# Patient Record
Sex: Female | Born: 1985 | Race: Black or African American | Hispanic: No | Marital: Married | Smoking: Never smoker
Health system: Southern US, Community
[De-identification: ages and names within clinical notes are randomized; demographics above are authoritative.]

## PROBLEM LIST (undated history)

## (undated) DIAGNOSIS — D219 Benign neoplasm of connective and other soft tissue, unspecified: Secondary | ICD-10-CM

## (undated) HISTORY — DX: Benign neoplasm of connective and other soft tissue, unspecified: D21.9

---
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
## 2018-05-08 ENCOUNTER — Ambulatory Visit
Admission: RE | Admit: 2018-05-08 | Discharge: 2018-05-08 | Disposition: A | Discharge status: Home / Self Care | Payer: 59 | Source: Ambulatory Visit | Attending: Sports Medicine | Admitting: Sports Medicine

## 2018-05-08 ENCOUNTER — Ambulatory Visit (INDEPENDENT_AMBULATORY_CARE_PROVIDER_SITE_OTHER): Payer: 59 | Admitting: Sports Medicine

## 2018-05-08 VITALS — BP 144/83 | Ht 64.0 in | Wt 269.0 lb

## 2018-05-08 DIAGNOSIS — M25561 Pain in right knee: Secondary | ICD-10-CM | POA: Diagnosis not present

## 2018-05-08 MED ORDER — MELOXICAM 15 MG PO TABS: ORAL_TABLET | ORAL | 0 refills | Status: DC

## 2018-05-08 NOTE — Progress Notes (Signed)
   Subjective:    Patient ID: Stacey Zuniga, female    DOB: [birth year], 33 y.o.   MRN: [id number]  HPI chief complaint: Right knee pain  Very pleasant 33 year old female comes in today after having injured her right knee while working out 2 days ago.  She denies any traumatic injury but began to experience diffuse pain around the knee after performing a lot of dead lifts and squats.  She has had pain in the anterior knee intermittently for quite some time but her current pain is along the medial knee and is different in nature than what she has experienced previously.  She did notice some swelling diffusely around the knee as well.  She has started to limp.  She tried Tylenol, Goody's powders, and muscle relaxers with minimal symptom improvement.  She denies any previous knee surgery.  Pain does not radiate.  Past medical history reviewed Medications reviewed Allergies reviewed    Review of Systems    As above Objective:   Physical Exam  Well-developed, overweight.  No acute distress.  Awake alert and oriented x3.  Vital signs reviewed  Right knee: Range of motion is 0 to 120 degrees.  Trace effusion.  She is tender to palpation along the medial joint line.  Some tenderness along the lateral joint line as well.  Knee is stable to ligamentous exam.  Positive Thessaly's.  No patellofemoral crepitus.  Neurovascularly intact distally.  Walking with a slight limp.  MSK ultrasound of the right knee is limited by body habitus but she does have a small joint effusion.  X-rays of the right knee show some mild degenerative changes in the medial compartment as well as a joint effusion.  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  Compression sleeve with activity.  She needs to avoid any sort of lower body exercise until follow-up with me in 2 weeks.   If symptoms persist or worsen consider merits of further diagnostic imaging.  Call with questions or concerns in the interim.

## 2018-05-09 ENCOUNTER — Encounter: Payer: Self-pay | Admitting: Sports Medicine

## 2018-05-22 ENCOUNTER — Ambulatory Visit (INDEPENDENT_AMBULATORY_CARE_PROVIDER_SITE_OTHER): Payer: 59 | Admitting: Sports Medicine

## 2018-05-22 ENCOUNTER — Other Ambulatory Visit: Payer: Self-pay

## 2018-05-22 VITALS — BP 116/88 | Ht 64.0 in | Wt 271.0 lb

## 2018-05-22 DIAGNOSIS — M25561 Pain in right knee: Secondary | ICD-10-CM | POA: Diagnosis not present

## 2018-05-22 NOTE — Progress Notes (Signed)
   Subjective:    Patient ID: Stacey Zuniga , female    DOB: 1985/07/07, 33 y.o.   MRN: [id number]  05/22/2018 Patient reports that she believes that her right knee has had some improvement.  States that she is able to bear weight now and walk however she is still having some concerns with certain activities such as squats and getting in and out of her car.  Patient states that overall she is doing better however she is not to 100%.  She is interested in investigating further today. DG of knee showed mild OA and medial compartment  05/08/2018 Very pleasant 33 year old female comes in today after having injured her right knee while working out 2 days ago.  She denies any traumatic injury but began to experience diffuse pain around the knee after performing a lot of dead lifts and squats.  She has had pain in the anterior knee intermittently for quite some time but her current pain is along the medial knee and is different in nature than what she has experienced previously.  She did notice some swelling diffusely around the knee as well.  She has started to limp.  She tried Tylenol, Goody's powders, and muscle relaxers with minimal symptom improvement.  She denies any previous knee surgery.  Pain does not radiate.    ROS per HPI    Objective:  Gen: NAD, comfortable in exam room  Knee, right: TTP noted along medial joint line. Inspection was negative for effusion. No obvious bony abnormalities or signs of osteophyte development.  Medial joint line tenderness; Patellar and quadriceps tendons unremarkable, and no tenderness of the pes anserine bursa. ROM normal in flexion and extension. Normal hamstring and quadriceps strength. Neurovascularly intact bilaterally.     Assessment & Plan:   Right knee pain 2/2 medial compartmental DJD vs degenerative meniscal tear  DG of right knee did show some mild OA.  Patient does report improvement in symptoms however after meloxicam as well as avoiding lower  body workouts.  However patient is still experiencing some symptoms with getting in and out of the car as well as some squats.  Discussed need for further diagnostic imaging and MRI scheduled during visit in order to evaluate further for meniscal tear.  Will call patient with results and discuss options based on MRI findings.  Stacey Chyann Ambrocio, DO PGY-2, [facility]   Patient seen and evaluated with the resident.  I agree with the above plan of care.  Patient has mild medial compartmental DJD but I am concerned about a medial meniscal tear.  MRI of the right knee to evaluate further.  Phone follow-up with those results when available and we will delineate a more definitive treatment plan based on those findings.

## 2018-05-26 ENCOUNTER — Other Ambulatory Visit: Payer: 59

## 2020-02-11 LAB — OB RESULTS CONSOLE HIV ANTIBODY (ROUTINE TESTING): HIV: NONREACTIVE

## 2020-02-11 LAB — OB RESULTS CONSOLE GC/CHLAMYDIA
Chlamydia: NEGATIVE
Gonorrhea: NEGATIVE

## 2020-02-11 LAB — OB RESULTS CONSOLE RUBELLA ANTIBODY, IGM: Rubella: IMMUNE

## 2020-03-14 IMAGING — DX DG KNEE AP/LAT W/ SUNRISE*R*
3 series · 3 of 3 positions shown · non-contrast
Comparison: None.

CLINICAL DATA: New onset right knee pain and swelling, no known
injury, initial encounter

EXAM:
RIGHT KNEE 3 VIEWS

[dg knee ap/lat w/ sunrise right (1 of 3)]
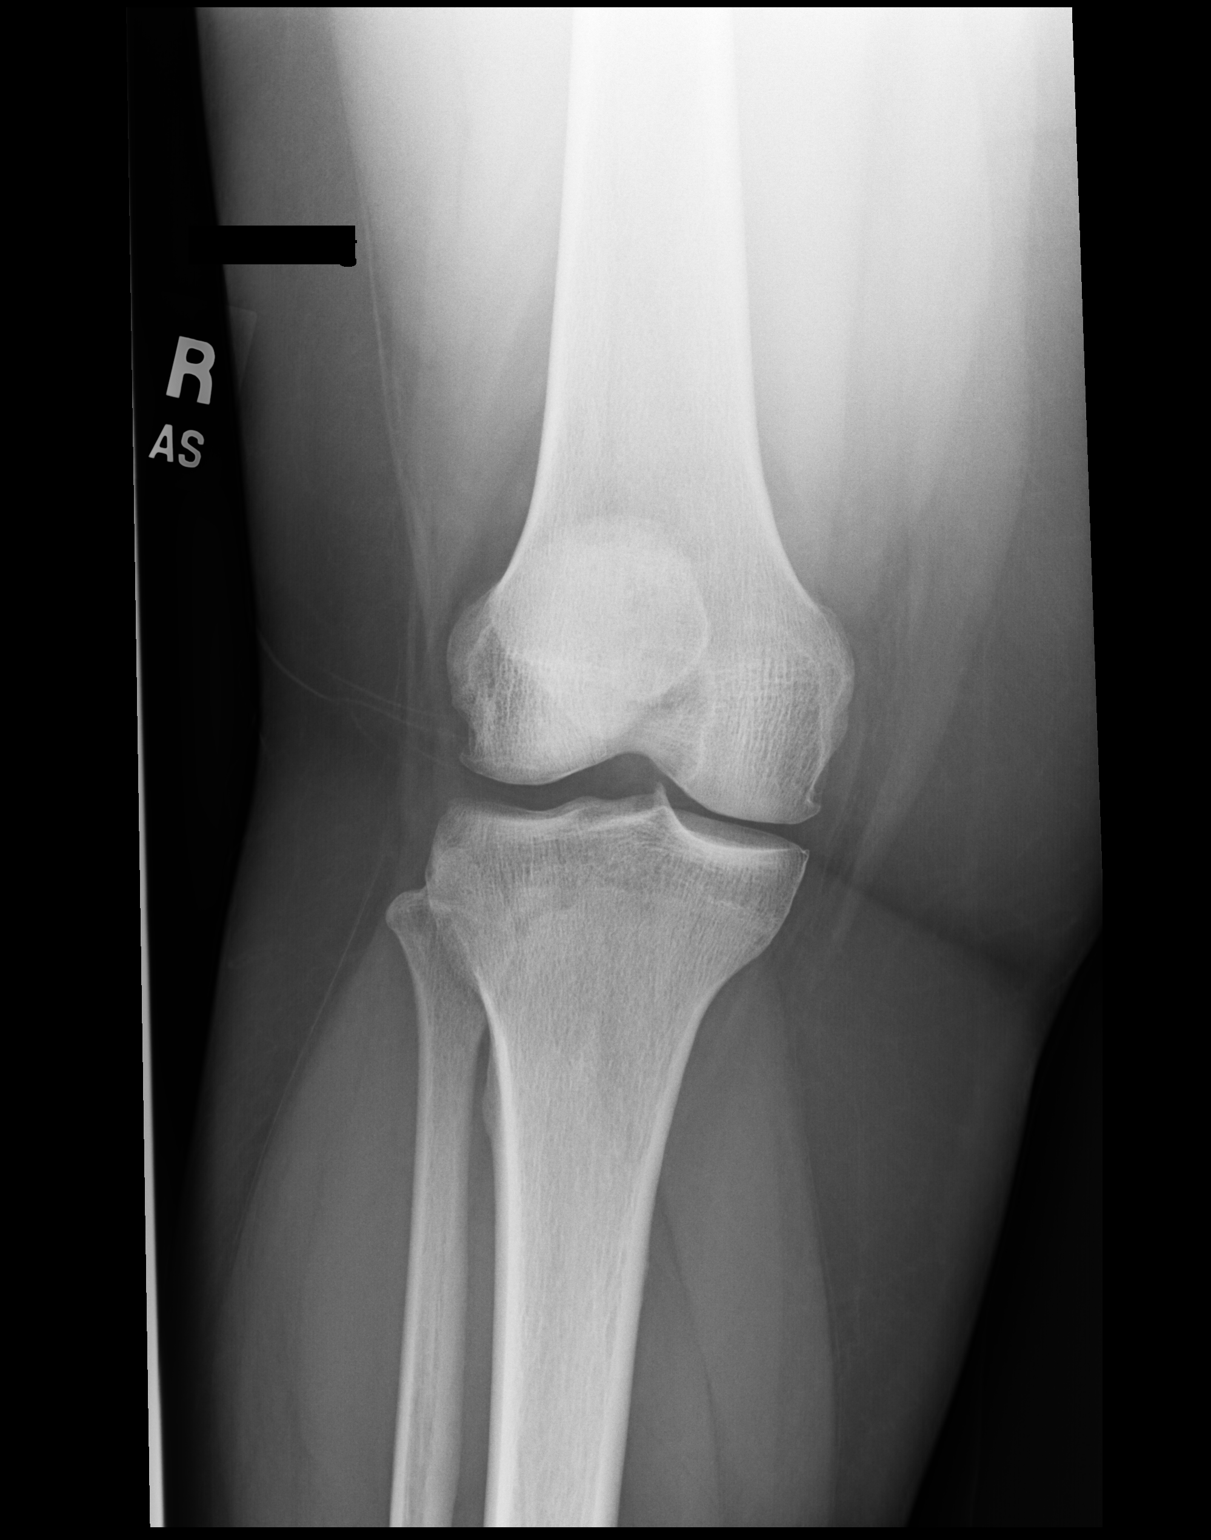

[dg knee ap/lat w/ sunrise right (2 of 3)]
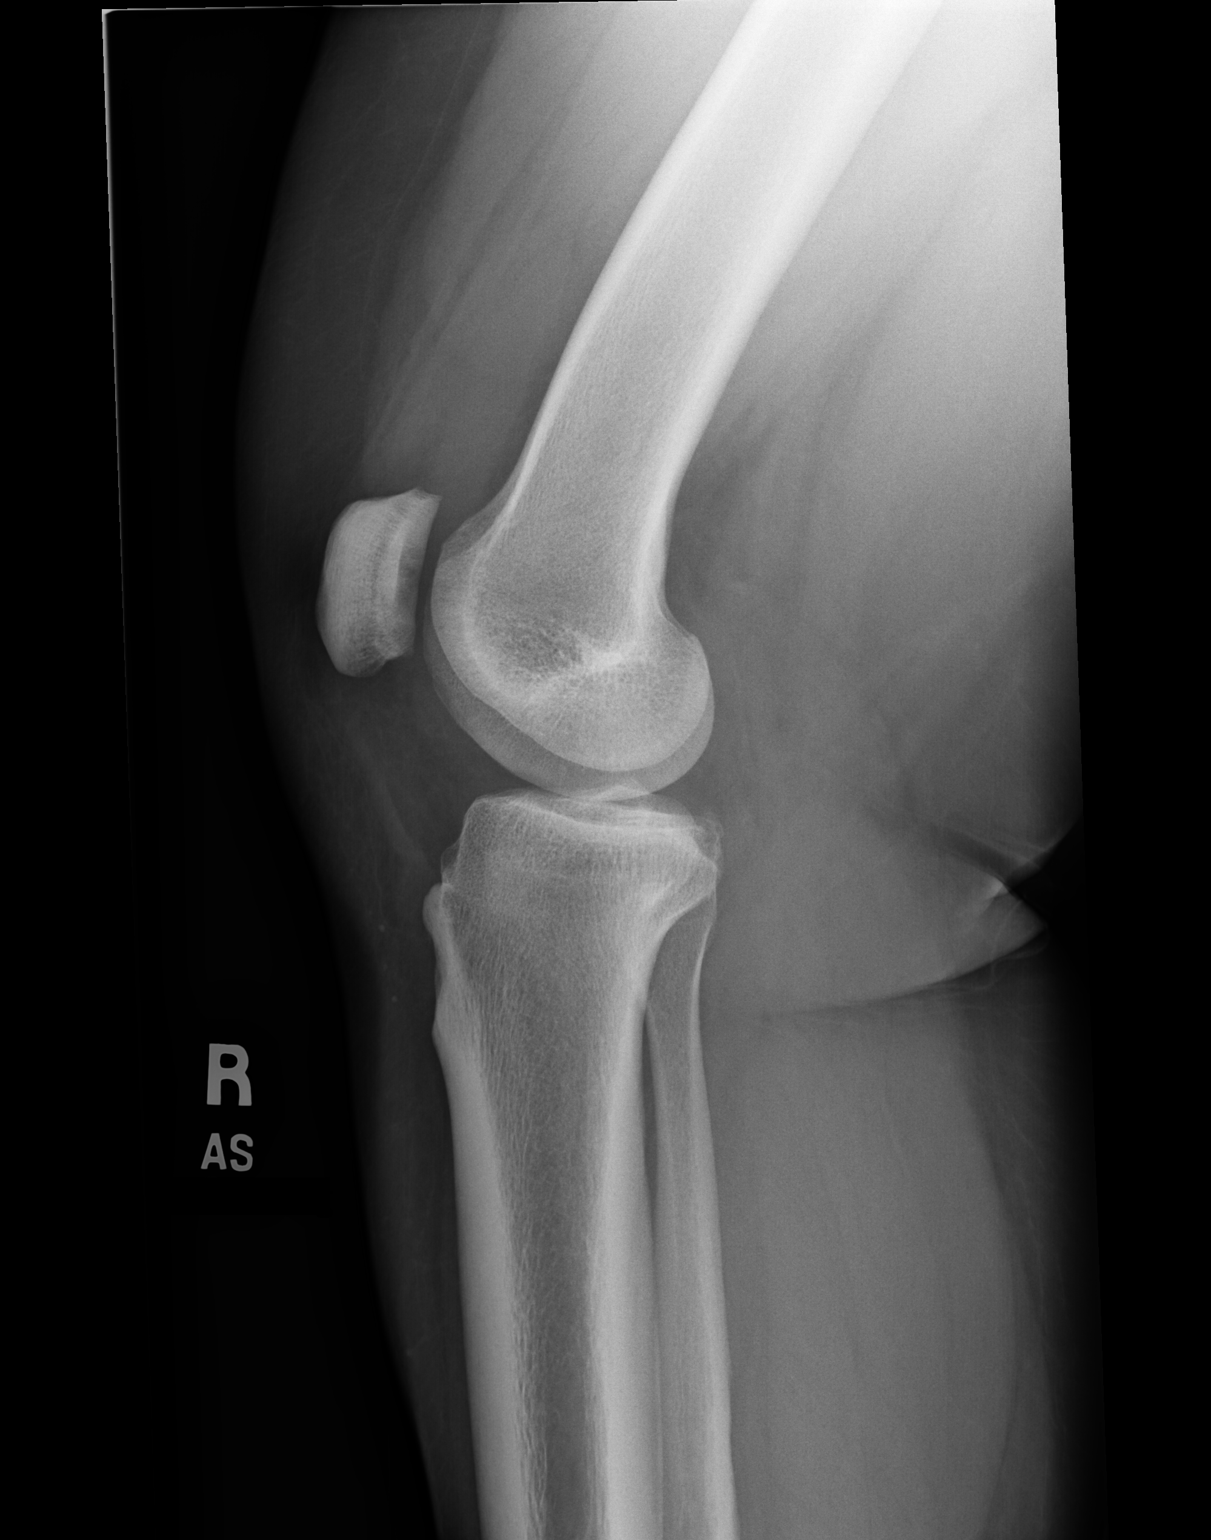

[dg knee ap/lat w/ sunrise right (3 of 3)]
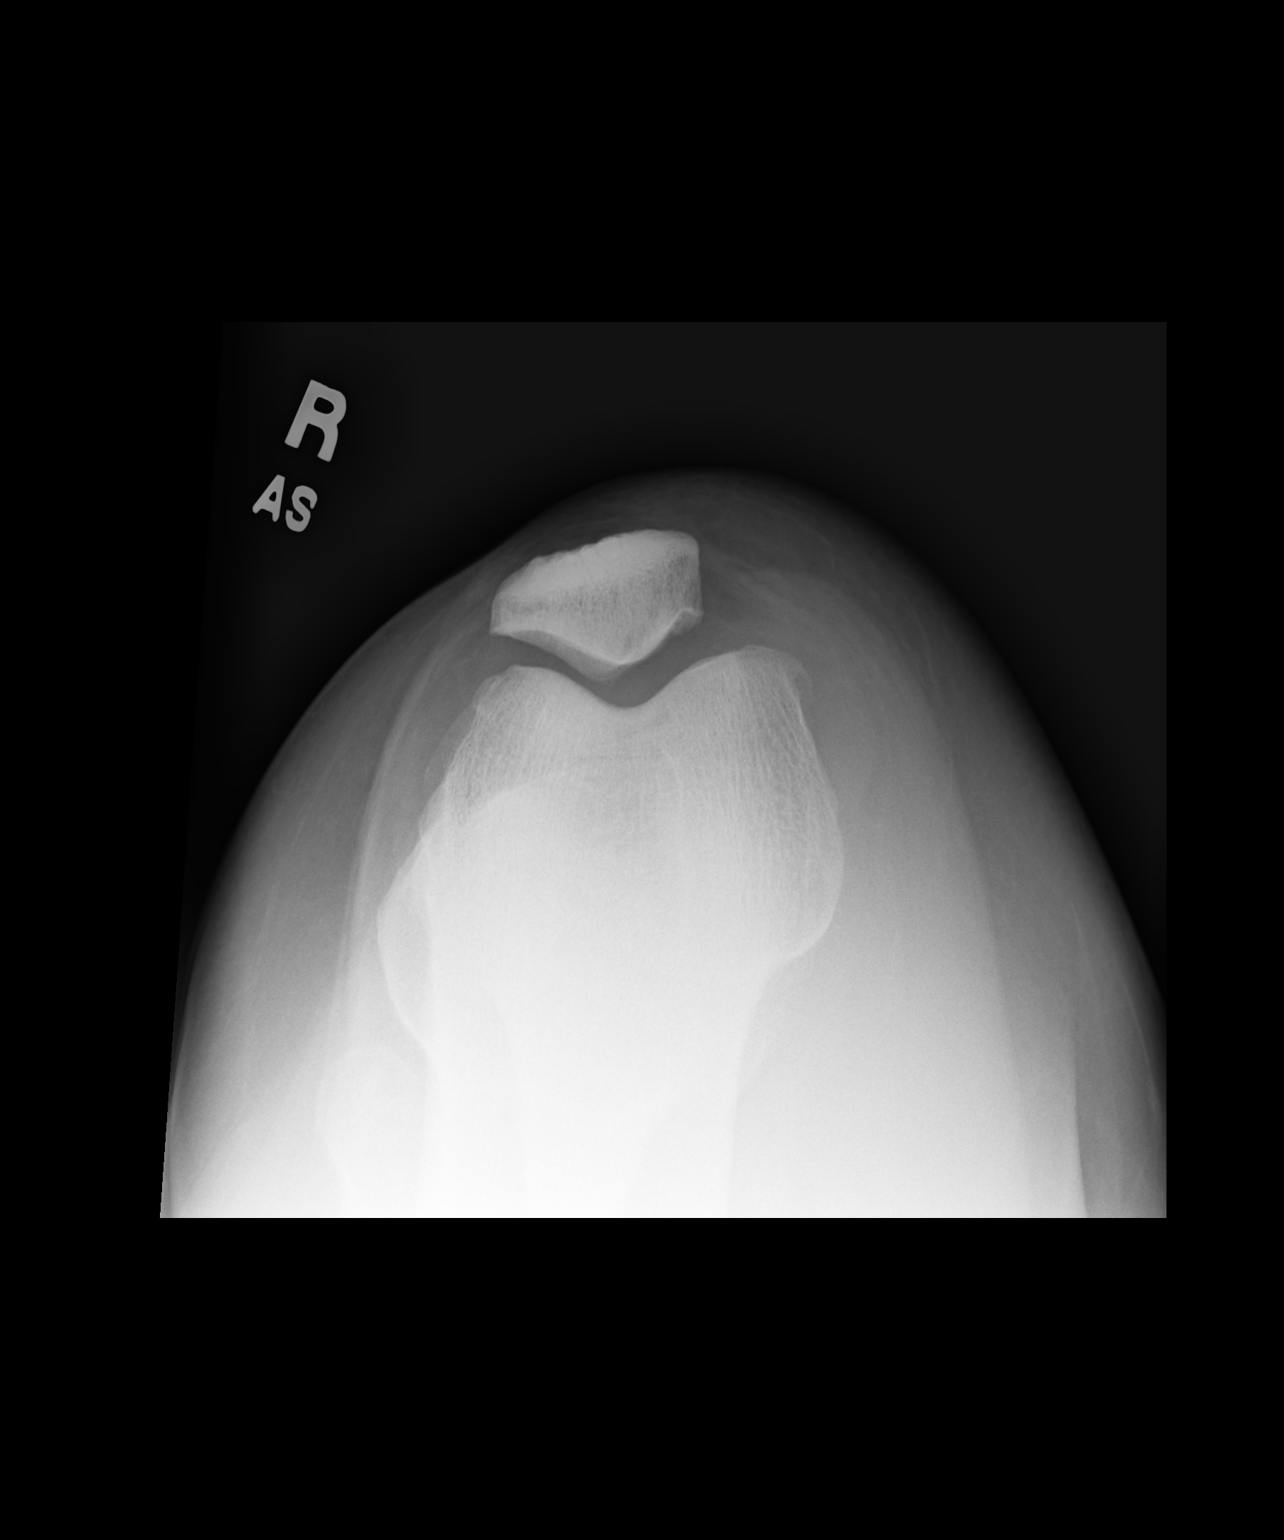

[3 of 3 positions shown; findings below may reference images not displayed]

FINDINGS: Tricompartmental degenerative changes are noted with small joint
effusion. No acute fracture or dislocation is noted.
IMPRESSION: Mild degenerative change with joint effusion.

## 2020-06-09 ENCOUNTER — Other Ambulatory Visit: Payer: Self-pay | Admitting: Obstetrics and Gynecology

## 2020-06-09 DIAGNOSIS — O26843 Uterine size-date discrepancy, third trimester: Secondary | ICD-10-CM

## 2020-06-09 DIAGNOSIS — O09523 Supervision of elderly multigravida, third trimester: Secondary | ICD-10-CM

## 2020-06-09 DIAGNOSIS — Z363 Encounter for antenatal screening for malformations: Secondary | ICD-10-CM

## 2020-06-09 DIAGNOSIS — Z3A31 31 weeks gestation of pregnancy: Secondary | ICD-10-CM

## 2020-06-18 ENCOUNTER — Encounter: Payer: Self-pay | Admitting: *Deleted

## 2020-06-19 ENCOUNTER — Other Ambulatory Visit: Payer: Self-pay

## 2020-06-19 ENCOUNTER — Other Ambulatory Visit: Payer: Self-pay | Admitting: *Deleted

## 2020-06-19 ENCOUNTER — Ambulatory Visit: Payer: 59 | Attending: Obstetrics and Gynecology | Admitting: *Deleted

## 2020-06-19 ENCOUNTER — Encounter: Payer: Self-pay | Admitting: *Deleted

## 2020-06-19 ENCOUNTER — Ambulatory Visit (HOSPITAL_BASED_OUTPATIENT_CLINIC_OR_DEPARTMENT_OTHER): Payer: 59

## 2020-06-19 VITALS — BP 137/88 | HR 94

## 2020-06-19 DIAGNOSIS — O09523 Supervision of elderly multigravida, third trimester: Secondary | ICD-10-CM

## 2020-06-19 DIAGNOSIS — Z3A31 31 weeks gestation of pregnancy: Secondary | ICD-10-CM | POA: Diagnosis not present

## 2020-06-19 DIAGNOSIS — O99213 Obesity complicating pregnancy, third trimester: Secondary | ICD-10-CM | POA: Insufficient documentation

## 2020-06-19 DIAGNOSIS — O26843 Uterine size-date discrepancy, third trimester: Secondary | ICD-10-CM | POA: Diagnosis not present

## 2020-06-19 DIAGNOSIS — O3123X2 Continuing pregnancy after intrauterine death of one fetus or more, third trimester, fetus 2: Secondary | ICD-10-CM | POA: Diagnosis not present

## 2020-06-19 DIAGNOSIS — Z363 Encounter for antenatal screening for malformations: Secondary | ICD-10-CM | POA: Diagnosis not present

## 2020-06-19 DIAGNOSIS — O3413 Maternal care for benign tumor of corpus uteri, third trimester: Secondary | ICD-10-CM | POA: Diagnosis not present

## 2020-06-19 DIAGNOSIS — O30043 Twin pregnancy, dichorionic/diamniotic, third trimester: Secondary | ICD-10-CM | POA: Diagnosis not present

## 2020-06-19 DIAGNOSIS — Z3A29 29 weeks gestation of pregnancy: Secondary | ICD-10-CM | POA: Insufficient documentation

## 2020-06-19 DIAGNOSIS — D259 Leiomyoma of uterus, unspecified: Secondary | ICD-10-CM | POA: Diagnosis not present

## 2020-06-19 DIAGNOSIS — O09513 Supervision of elderly primigravida, third trimester: Secondary | ICD-10-CM

## 2020-07-01 LAB — OB RESULTS CONSOLE RPR: RPR: NONREACTIVE

## 2020-07-03 ENCOUNTER — Ambulatory Visit: Payer: 59

## 2020-07-17 ENCOUNTER — Encounter: Payer: Self-pay | Admitting: *Deleted

## 2020-07-17 ENCOUNTER — Ambulatory Visit (HOSPITAL_BASED_OUTPATIENT_CLINIC_OR_DEPARTMENT_OTHER): Payer: 59

## 2020-07-17 ENCOUNTER — Other Ambulatory Visit: Payer: Self-pay

## 2020-07-17 ENCOUNTER — Ambulatory Visit: Payer: 59 | Attending: Maternal & Fetal Medicine | Admitting: *Deleted

## 2020-07-17 VITALS — BP 139/83 | HR 97

## 2020-07-17 DIAGNOSIS — Z362 Encounter for other antenatal screening follow-up: Secondary | ICD-10-CM

## 2020-07-17 DIAGNOSIS — O99213 Obesity complicating pregnancy, third trimester: Secondary | ICD-10-CM

## 2020-07-17 DIAGNOSIS — O26843 Uterine size-date discrepancy, third trimester: Secondary | ICD-10-CM

## 2020-07-17 DIAGNOSIS — O09523 Supervision of elderly multigravida, third trimester: Secondary | ICD-10-CM | POA: Insufficient documentation

## 2020-07-17 DIAGNOSIS — Z3A33 33 weeks gestation of pregnancy: Secondary | ICD-10-CM | POA: Diagnosis not present

## 2020-07-17 DIAGNOSIS — O09513 Supervision of elderly primigravida, third trimester: Secondary | ICD-10-CM

## 2020-07-17 DIAGNOSIS — O30003 Twin pregnancy, unspecified number of placenta and unspecified number of amniotic sacs, third trimester: Secondary | ICD-10-CM | POA: Insufficient documentation

## 2020-07-17 DIAGNOSIS — O3123X0 Continuing pregnancy after intrauterine death of one fetus or more, third trimester, not applicable or unspecified: Secondary | ICD-10-CM | POA: Diagnosis not present

## 2020-07-18 ENCOUNTER — Other Ambulatory Visit: Payer: Self-pay | Admitting: *Deleted

## 2020-07-18 DIAGNOSIS — R638 Other symptoms and signs concerning food and fluid intake: Secondary | ICD-10-CM

## 2020-07-31 ENCOUNTER — Ambulatory Visit: Payer: 59

## 2020-08-07 LAB — OB RESULTS CONSOLE GBS: GBS: NEGATIVE

## 2020-08-08 ENCOUNTER — Encounter: Payer: Self-pay | Admitting: *Deleted

## 2020-08-08 ENCOUNTER — Ambulatory Visit: Payer: 59 | Admitting: *Deleted

## 2020-08-08 ENCOUNTER — Ambulatory Visit: Payer: 59 | Attending: Obstetrics and Gynecology

## 2020-08-08 ENCOUNTER — Other Ambulatory Visit: Payer: Self-pay

## 2020-08-08 VITALS — BP 141/84 | HR 95

## 2020-08-08 DIAGNOSIS — R638 Other symptoms and signs concerning food and fluid intake: Secondary | ICD-10-CM | POA: Diagnosis not present

## 2020-08-08 DIAGNOSIS — O09513 Supervision of elderly primigravida, third trimester: Secondary | ICD-10-CM

## 2020-08-14 ENCOUNTER — Encounter (HOSPITAL_COMMUNITY): Payer: Self-pay | Admitting: Obstetrics and Gynecology

## 2020-08-14 ENCOUNTER — Inpatient Hospital Stay (HOSPITAL_COMMUNITY)
Admission: AD | Admit: 2020-08-14 | Discharge: 2020-08-17 | DRG: 788 | Disposition: A | Discharge status: Home / Self Care | Payer: 59 | Attending: Obstetrics and Gynecology | Admitting: Obstetrics and Gynecology

## 2020-08-14 DIAGNOSIS — Z3A37 37 weeks gestation of pregnancy: Secondary | ICD-10-CM

## 2020-08-14 DIAGNOSIS — O1404 Mild to moderate pre-eclampsia, complicating childbirth: Secondary | ICD-10-CM | POA: Diagnosis present

## 2020-08-14 DIAGNOSIS — D259 Leiomyoma of uterus, unspecified: Secondary | ICD-10-CM | POA: Diagnosis present

## 2020-08-14 DIAGNOSIS — O3413 Maternal care for benign tumor of corpus uteri, third trimester: Secondary | ICD-10-CM | POA: Diagnosis present

## 2020-08-14 DIAGNOSIS — Z20822 Contact with and (suspected) exposure to covid-19: Secondary | ICD-10-CM | POA: Diagnosis present

## 2020-08-14 DIAGNOSIS — O326XX0 Maternal care for compound presentation, not applicable or unspecified: Secondary | ICD-10-CM | POA: Diagnosis present

## 2020-08-14 DIAGNOSIS — O99214 Obesity complicating childbirth: Secondary | ICD-10-CM | POA: Diagnosis present

## 2020-08-14 DIAGNOSIS — O1490 Unspecified pre-eclampsia, unspecified trimester: Secondary | ICD-10-CM | POA: Diagnosis present

## 2020-08-14 LAB — TYPE AND SCREEN
ABO/RH(D): O POS
Antibody Screen: NEGATIVE

## 2020-08-14 LAB — CBC
HCT: 33 % — ABNORMAL LOW (ref 36.0–46.0)
HCT: 34 % — ABNORMAL LOW (ref 36.0–46.0)
Hemoglobin: 10.9 g/dL — ABNORMAL LOW (ref 12.0–15.0)
Hemoglobin: 11.2 g/dL — ABNORMAL LOW (ref 12.0–15.0)
MCH: 30.9 pg (ref 26.0–34.0)
MCH: 30.9 pg (ref 26.0–34.0)
MCHC: 33 g/dL (ref 30.0–36.0)
MCHC: 32.9 g/dL (ref 30.0–36.0)
MCV: 93.5 fL (ref 80.0–100.0)
MCV: 93.9 fL (ref 80.0–100.0)
Platelets: 236 10*3/uL (ref 150–400)
Platelets: 243 10*3/uL (ref 150–400)
RBC: 3.53 MIL/uL — ABNORMAL LOW (ref 3.87–5.11)
RBC: 3.62 MIL/uL — ABNORMAL LOW (ref 3.87–5.11)
RDW: 13.4 % (ref 11.5–15.5)
RDW: 13.5 % (ref 11.5–15.5)
WBC: 7.6 10*3/uL (ref 4.0–10.5)
WBC: 7.7 10*3/uL (ref 4.0–10.5)
nRBC: 0 % (ref 0.0–0.2)
nRBC: 0 % (ref 0.0–0.2)

## 2020-08-14 LAB — COMPREHENSIVE METABOLIC PANEL
ALT: 13 U/L (ref 0–44)
AST: 20 U/L (ref 15–41)
Albumin: 2.8 g/dL — ABNORMAL LOW (ref 3.5–5.0)
Alkaline Phosphatase: 87 U/L (ref 38–126)
Anion gap: 9 (ref 5–15)
BUN: <5 mg/dL — ABNORMAL LOW (ref 6–20)
CO2: 19 mmol/L — ABNORMAL LOW (ref 22–32)
Calcium: 8.6 mg/dL — ABNORMAL LOW (ref 8.9–10.3)
Chloride: 107 mmol/L (ref 98–111)
Creatinine, Ser: 0.46 mg/dL (ref 0.44–1.00)
GFR, Estimated: >60 mL/min (ref >60)
Glucose, Bld: 109 mg/dL — ABNORMAL HIGH (ref 70–99)
Potassium: 3.5 mmol/L (ref 3.5–5.1)
Sodium: 135 mmol/L (ref 135–145)
Total Bilirubin: 0.4 mg/dL (ref 0.3–1.2)
Total Protein: 6.2 g/dL — ABNORMAL LOW (ref 6.5–8.1)

## 2020-08-14 LAB — PROTEIN / CREATININE RATIO, URINE
Creatinine, Urine: 173.49 mg/dL
Protein Creatinine Ratio: 0.37 mg/mg{Cre} — ABNORMAL HIGH (ref 0.00–0.15)
Total Protein, Urine: 64 mg/dL

## 2020-08-14 LAB — RESP PANEL BY RT-PCR (FLU A&B, COVID) ARPGX2
Influenza A by PCR: NEGATIVE
Influenza B by PCR: NEGATIVE
SARS Coronavirus 2 by RT PCR: NEGATIVE

## 2020-08-14 LAB — URIC ACID: Uric Acid, Serum: 5.1 mg/dL (ref 2.5–7.1)

## 2020-08-14 MED ORDER — OXYTOCIN BOLUS FROM INFUSION: 333.0000 mL | Freq: Once | INTRAVENOUS | Status: DC

## 2020-08-14 MED ORDER — ACETAMINOPHEN 325 MG PO TABS: 650.0000 mg | ORAL_TABLET | ORAL | Status: DC | PRN

## 2020-08-14 MED ORDER — ONDANSETRON HCL 4 MG/2ML IJ SOLN: 4.0000 mg | Freq: Four times a day (QID) | INTRAMUSCULAR | Status: DC | PRN

## 2020-08-14 MED ORDER — FLEET ENEMA 7-19 GM/118ML RE ENEM: 1.0000 | ENEMA | RECTAL | Status: DC | PRN

## 2020-08-14 MED ORDER — LABETALOL HCL 100 MG PO TABS
100.0000 mg | ORAL_TABLET | Freq: Two times a day (BID) | ORAL | Status: AC
  Administered 2020-08-15 – 2020-08-16 (×3): 100 mg via ORAL
  Filled 2020-08-14 (×3): qty 1

## 2020-08-14 MED ORDER — SODIUM CHLORIDE 0.9% FLUSH: 3.0000 mL | Freq: Two times a day (BID) | INTRAVENOUS | Status: DC

## 2020-08-14 MED ORDER — OXYCODONE-ACETAMINOPHEN 5-325 MG PO TABS: 2.0000 | ORAL_TABLET | ORAL | Status: DC | PRN

## 2020-08-14 MED ORDER — OXYTOCIN-SODIUM CHLORIDE 30-0.9 UT/500ML-% IV SOLN: 2.5000 [IU]/h | INTRAVENOUS | Status: DC

## 2020-08-14 MED ORDER — LABETALOL HCL 5 MG/ML IV SOLN: 40.0000 mg | INTRAVENOUS | Status: DC | PRN

## 2020-08-14 MED ORDER — LACTATED RINGERS IV SOLN
500.0000 mL | INTRAVENOUS | Status: DC | PRN
  Administered 2020-08-14: 250 mL via INTRAVENOUS

## 2020-08-14 MED ORDER — SOD CITRATE-CITRIC ACID 500-334 MG/5ML PO SOLN: 30.0000 mL | ORAL | Status: DC | PRN

## 2020-08-14 MED ORDER — TERBUTALINE SULFATE 1 MG/ML IJ SOLN
0.2500 mg | Freq: Once | INTRAMUSCULAR | Status: AC | PRN
  Administered 2020-08-14: 0.25 mg via SUBCUTANEOUS
  Filled 2020-08-14: qty 1

## 2020-08-14 MED ORDER — HYDRALAZINE HCL 20 MG/ML IJ SOLN: 10.0000 mg | INTRAMUSCULAR | Status: DC | PRN

## 2020-08-14 MED ORDER — SODIUM CHLORIDE 0.9% FLUSH: 3.0000 mL | INTRAVENOUS | Status: DC | PRN

## 2020-08-14 MED ORDER — MISOPROSTOL 50MCG HALF TABLET
50.0000 ug | ORAL_TABLET | ORAL | Status: DC
  Administered 2020-08-14: 50 ug via ORAL
  Filled 2020-08-14: qty 1

## 2020-08-14 MED ORDER — MISOPROSTOL 25 MCG QUARTER TABLET: 25.0000 ug | ORAL_TABLET | ORAL | Status: DC | PRN

## 2020-08-14 MED ORDER — LABETALOL HCL 5 MG/ML IV SOLN: 20.0000 mg | INTRAVENOUS | Status: DC | PRN

## 2020-08-14 MED ORDER — LABETALOL HCL 5 MG/ML IV SOLN: 80.0000 mg | INTRAVENOUS | Status: DC | PRN

## 2020-08-14 MED ORDER — OXYCODONE-ACETAMINOPHEN 5-325 MG PO TABS: 1.0000 | ORAL_TABLET | ORAL | Status: DC | PRN

## 2020-08-14 MED ORDER — SODIUM CHLORIDE 0.9 % IV SOLN: 250.0000 mL | INTRAVENOUS | Status: DC | PRN

## 2020-08-14 MED ORDER — LIDOCAINE HCL (PF) 1 % IJ SOLN: 30.0000 mL | INTRAMUSCULAR | Status: DC | PRN

## 2020-08-14 NOTE — MAU Provider Note (Signed)
History     Chief Complaint  Patient presents with   Hypertension   Headache  35 yo G1P0 MBF @ 37 3/[redacted] week gestation sent from the office for further evaluation due to elevated severe range BP in the office. Pt notes mild h/a, denies visual changes or epigastric pain. (+) leg swellings. Recent PIH labs were normal . Reactive NST in offic  OB History     Gravida  1   Para      Term      Preterm      AB      Living         SAB      IAB      Ectopic      Multiple      Live Births              Past Medical History:  Diagnosis Date   Fibroid     Past Surgical History:  Procedure Laterality Date   HERNIA REPAIR  1987   TONSILLECTOMY AND ADENOIDECTOMY  1990    Family History  Problem Relation Age of Onset   Hypertension Mother    Arthritis Mother     Social History   Tobacco Use   Smoking status: Never   Smokeless tobacco: Never  Vaping Use   Vaping Use: Never used  Substance Use Topics   Alcohol use: Never   Drug use: Never    Allergies: No Known Allergies  Medications Prior to Admission  Medication Sig Dispense Refill Last Dose   doxylamine, Sleep, (UNISOM) 25 MG tablet Take 25 mg by mouth at bedtime as needed.   Past Week   fluticasone (FLONASE) 50 MCG/ACT nasal spray Place into both nostrils daily.   08/14/2020   pantoprazole (PROTONIX) 40 MG tablet Take 40 mg by mouth daily.   08/13/2020   Prenatal Vit-Fe Fumarate-FA (PRENATAL MULTIVITAMIN) TABS tablet Take 1 tablet by mouth daily at 12 noon.   08/13/2020   meloxicam (MOBIC) 15 MG tablet Take one pill a day with food for 7 days and then prn thereafter (Patient not taking: Reported on 05/22/2018) 40 tablet 0      Physical Exam   Blood pressure 136/83, pulse 89, temperature 99 F (37.2 C), temperature source Oral, resp. rate 18, height 5\' 4"  (1.626 m), weight (!) 136.5 kg, last menstrual period 11/26/2019, SpO2 99 %.  General appearance: alert, cooperative, and no distress Lungs: clear to  auscultation bilaterally Heart: S1, S2 normal Abdomen:  gravid Extremities: edema 2+ Pelvic exam in office: flared/60/-3 Tracing: baseline 150 (+) accels to 180 no ctx. reactive ED Course  IMP: gestational HTN r/o preeclampsia IUP @ 37 3/7 weeks Fibroid uterus  P) PIH labs. Serial BP. NST MDM    Addendum: CBC    Component Value Date/Time   WBC 7.7 08/14/2020 2104   RBC 3.62 (L) 08/14/2020 2104   HGB 11.2 (L) 08/14/2020 2104   HCT 34.0 (L) 08/14/2020 2104   PLT 243 08/14/2020 2104   MCV 93.9 08/14/2020 2104   MCH 30.9 08/14/2020 2104   MCHC 32.9 08/14/2020 2104   RDW 13.5 08/14/2020 2104    CMP Latest Ref Rng & Units 08/14/2020  Glucose 70 - 99 mg/dL 109(H)  BUN 6 - 20 mg/dL <5(L)  Creatinine 0.44 - 1.00 mg/dL 0.46  Sodium 135 - 145 mmol/L 135  Potassium 3.5 - 5.1 mmol/L 3.5  Chloride 98 - 111 mmol/L 107  CO2 22 - 32 mmol/L 19(L)  Calcium 8.9 - 10.3 mg/dL 8.6(L)  Total Protein 6.5 - 8.1 g/dL 6.2(L)  Total Bilirubin 0.3 - 1.2 mg/dL 0.4  Alkaline Phos 38 - 126 U/L 87  AST 15 - 41 U/L 20  ALT 0 - 44 U/L 13   PCR 0.37  Labs c/w preeclampsia Will admit for IOL using cytotec. Magnesium for seizure prophylaxis  Marvene Staff, MD 6:35 PM 08/14/2020

## 2020-08-14 NOTE — MAU Note (Signed)
Sent from office,BP been creeping past few wks.  High today.  +HA (has not taken anything), denies visual changes, no epigastric pain, reports increase swelling in hands and feet.

## 2020-08-15 ENCOUNTER — Ambulatory Visit: Payer: 59

## 2020-08-15 ENCOUNTER — Inpatient Hospital Stay (HOSPITAL_COMMUNITY): Payer: 59 | Admitting: Anesthesiology

## 2020-08-15 ENCOUNTER — Other Ambulatory Visit: Payer: Self-pay

## 2020-08-15 ENCOUNTER — Encounter (HOSPITAL_COMMUNITY): Payer: Self-pay | Admitting: Obstetrics and Gynecology

## 2020-08-15 ENCOUNTER — Encounter (HOSPITAL_COMMUNITY)
Admission: AD | Disposition: A | Discharge status: Home / Self Care | Payer: Self-pay | Source: Home / Self Care | Attending: Obstetrics and Gynecology

## 2020-08-15 LAB — MAGNESIUM: Magnesium: 3.3 mg/dL — ABNORMAL HIGH (ref 1.7–2.4)

## 2020-08-15 LAB — RPR: RPR Ser Ql: NONREACTIVE

## 2020-08-15 SURGERY — Surgical Case
Anesthesia: Spinal

## 2020-08-15 MED ORDER — MORPHINE SULFATE (PF) 0.5 MG/ML IJ SOLN
INTRAMUSCULAR | Status: DC | PRN
  Administered 2020-08-15: .15 mg via INTRATHECAL

## 2020-08-15 MED ORDER — MAGNESIUM SULFATE BOLUS VIA INFUSION
4.0000 g | Freq: Once | INTRAVENOUS | Status: AC
  Administered 2020-08-15: 4 g via INTRAVENOUS
  Filled 2020-08-15: qty 1000

## 2020-08-15 MED ORDER — MORPHINE SULFATE (PF) 0.5 MG/ML IJ SOLN
INTRAMUSCULAR | Status: AC
  Filled 2020-08-15: qty 10

## 2020-08-15 MED ORDER — LACTATED RINGERS IV SOLN: INTRAVENOUS | Status: DC | PRN

## 2020-08-15 MED ORDER — BUPIVACAINE HCL (PF) 0.25 % IJ SOLN
INTRAMUSCULAR | Status: DC | PRN
  Administered 2020-08-15: 10 mL

## 2020-08-15 MED ORDER — OXYCODONE HCL 5 MG PO TABS
5.0000 mg | ORAL_TABLET | ORAL | Status: DC | PRN
Start: 2020-08-15 — End: 2020-08-17
  Administered 2020-08-16: 5 mg via ORAL
  Administered 2020-08-16: 10 mg via ORAL
  Administered 2020-08-16 (×2): 5 mg via ORAL
  Filled 2020-08-15: qty 1
  Filled 2020-08-15: qty 2
  Filled 2020-08-15 (×2): qty 1

## 2020-08-15 MED ORDER — ONDANSETRON HCL 4 MG/2ML IJ SOLN
INTRAMUSCULAR | Status: AC
  Filled 2020-08-15: qty 2

## 2020-08-15 MED ORDER — SODIUM CHLORIDE 0.9% FLUSH: 3.0000 mL | INTRAVENOUS | Status: DC | PRN

## 2020-08-15 MED ORDER — DIPHENHYDRAMINE HCL 25 MG PO CAPS: 25.0000 mg | ORAL_CAPSULE | Freq: Four times a day (QID) | ORAL | Status: DC | PRN

## 2020-08-15 MED ORDER — NALBUPHINE HCL 10 MG/ML IJ SOLN: 5.0000 mg | Freq: Once | INTRAMUSCULAR | Status: DC | PRN

## 2020-08-15 MED ORDER — KETOROLAC TROMETHAMINE 30 MG/ML IJ SOLN
30.0000 mg | Freq: Four times a day (QID) | INTRAMUSCULAR | Status: AC | PRN
  Administered 2020-08-15: 30 mg via INTRAMUSCULAR

## 2020-08-15 MED ORDER — NALOXONE HCL 4 MG/10ML IJ SOLN
1.0000 ug/kg/h | INTRAVENOUS | Status: DC | PRN
  Filled 2020-08-15: qty 5

## 2020-08-15 MED ORDER — BUPIVACAINE HCL (PF) 0.25 % IJ SOLN
INTRAMUSCULAR | Status: AC
  Filled 2020-08-15: qty 10

## 2020-08-15 MED ORDER — OXYTOCIN-SODIUM CHLORIDE 30-0.9 UT/500ML-% IV SOLN
INTRAVENOUS | Status: AC
  Filled 2020-08-15: qty 500

## 2020-08-15 MED ORDER — DIPHENHYDRAMINE HCL 50 MG/ML IJ SOLN: 12.5000 mg | INTRAMUSCULAR | Status: DC | PRN

## 2020-08-15 MED ORDER — FLUTICASONE PROPIONATE 50 MCG/ACT NA SUSP
1.0000 | Freq: Every day | NASAL | Status: DC
  Administered 2020-08-15: 1 via NASAL
  Filled 2020-08-15: qty 16

## 2020-08-15 MED ORDER — ACETAMINOPHEN 500 MG PO TABS
1000.0000 mg | ORAL_TABLET | Freq: Four times a day (QID) | ORAL | Status: DC
  Administered 2020-08-15 – 2020-08-17 (×9): 1000 mg via ORAL
  Filled 2020-08-15 (×10): qty 2

## 2020-08-15 MED ORDER — ONDANSETRON HCL 4 MG/2ML IJ SOLN
INTRAMUSCULAR | Status: DC | PRN
  Administered 2020-08-15: 4 mg via INTRAVENOUS

## 2020-08-15 MED ORDER — NALBUPHINE HCL 10 MG/ML IJ SOLN: 5.0000 mg | INTRAMUSCULAR | Status: DC | PRN

## 2020-08-15 MED ORDER — PHENYLEPHRINE HCL-NACL 20-0.9 MG/250ML-% IV SOLN
INTRAVENOUS | Status: AC
  Filled 2020-08-15: qty 250

## 2020-08-15 MED ORDER — COCONUT OIL OIL: 1.0000 "application " | TOPICAL_OIL | Status: DC | PRN

## 2020-08-15 MED ORDER — MAGNESIUM SULFATE 2 GM/50ML IV SOLN: 2.0000 g | Freq: Once | INTRAVENOUS | Status: DC

## 2020-08-15 MED ORDER — MAGNESIUM SULFATE 40 GM/1000ML IV SOLN
INTRAVENOUS | Status: AC
  Filled 2020-08-15: qty 1000

## 2020-08-15 MED ORDER — KETOROLAC TROMETHAMINE 30 MG/ML IJ SOLN: 30.0000 mg | Freq: Four times a day (QID) | INTRAMUSCULAR | Status: AC | PRN

## 2020-08-15 MED ORDER — PHENYLEPHRINE HCL-NACL 20-0.9 MG/250ML-% IV SOLN
INTRAVENOUS | Status: DC | PRN
  Administered 2020-08-15: 30 ug/min via INTRAVENOUS

## 2020-08-15 MED ORDER — MAGNESIUM SULFATE BOLUS VIA INFUSION
2.0000 g | Freq: Once | INTRAVENOUS | Status: AC
  Administered 2020-08-15: 2 g via INTRAVENOUS
  Filled 2020-08-15: qty 1000

## 2020-08-15 MED ORDER — CEFAZOLIN IN SODIUM CHLORIDE 3-0.9 GM/100ML-% IV SOLN
INTRAVENOUS | Status: AC
  Filled 2020-08-15: qty 100

## 2020-08-15 MED ORDER — KETOROLAC TROMETHAMINE 30 MG/ML IJ SOLN
INTRAMUSCULAR | Status: AC
  Filled 2020-08-15: qty 1

## 2020-08-15 MED ORDER — OXYTOCIN-SODIUM CHLORIDE 30-0.9 UT/500ML-% IV SOLN: 2.5000 [IU]/h | INTRAVENOUS | Status: AC

## 2020-08-15 MED ORDER — MENTHOL 3 MG MT LOZG: 1.0000 | LOZENGE | OROMUCOSAL | Status: DC | PRN

## 2020-08-15 MED ORDER — PRENATAL MULTIVITAMIN CH
1.0000 | ORAL_TABLET | Freq: Every day | ORAL | Status: DC
  Administered 2020-08-15: 1 via ORAL
  Filled 2020-08-15 (×3): qty 1

## 2020-08-15 MED ORDER — IBUPROFEN 600 MG PO TABS
600.0000 mg | ORAL_TABLET | Freq: Four times a day (QID) | ORAL | Status: DC
  Administered 2020-08-15 – 2020-08-17 (×10): 600 mg via ORAL
  Filled 2020-08-15 (×12): qty 1

## 2020-08-15 MED ORDER — ONDANSETRON HCL 4 MG/2ML IJ SOLN
4.0000 mg | Freq: Three times a day (TID) | INTRAMUSCULAR | Status: DC | PRN
Start: 2020-08-15 — End: 2020-08-17

## 2020-08-15 MED ORDER — DIBUCAINE (PERIANAL) 1 % EX OINT: 1.0000 "application " | TOPICAL_OINTMENT | CUTANEOUS | Status: DC | PRN

## 2020-08-15 MED ORDER — ACETAMINOPHEN 10 MG/ML IV SOLN
INTRAVENOUS | Status: DC | PRN
  Administered 2020-08-15: 1000 mg via INTRAVENOUS

## 2020-08-15 MED ORDER — DIPHENHYDRAMINE HCL 25 MG PO CAPS: 25.0000 mg | ORAL_CAPSULE | ORAL | Status: DC | PRN

## 2020-08-15 MED ORDER — STERILE WATER FOR IRRIGATION IR SOLN
Status: DC | PRN
  Administered 2020-08-15: 1

## 2020-08-15 MED ORDER — WITCH HAZEL-GLYCERIN EX PADS: 1.0000 "application " | MEDICATED_PAD | CUTANEOUS | Status: DC | PRN

## 2020-08-15 MED ORDER — BUPIVACAINE IN DEXTROSE 0.75-8.25 % IT SOLN
INTRATHECAL | Status: DC | PRN
  Administered 2020-08-15: 1.8 mL via INTRATHECAL

## 2020-08-15 MED ORDER — CEFAZOLIN SODIUM-DEXTROSE 2-4 GM/100ML-% IV SOLN
INTRAVENOUS | Status: AC
  Filled 2020-08-15: qty 100

## 2020-08-15 MED ORDER — SIMETHICONE 80 MG PO CHEW: 80.0000 mg | CHEWABLE_TABLET | ORAL | Status: DC | PRN

## 2020-08-15 MED ORDER — DEXTROSE 5 % IV SOLN
INTRAVENOUS | Status: DC | PRN
  Administered 2020-08-15: 3 g via INTRAVENOUS

## 2020-08-15 MED ORDER — OXYTOCIN-SODIUM CHLORIDE 30-0.9 UT/500ML-% IV SOLN
INTRAVENOUS | Status: DC | PRN
  Administered 2020-08-15: 400 mL via INTRAVENOUS

## 2020-08-15 MED ORDER — DEXAMETHASONE SODIUM PHOSPHATE 4 MG/ML IJ SOLN
INTRAMUSCULAR | Status: AC
  Filled 2020-08-15: qty 1

## 2020-08-15 MED ORDER — SENNOSIDES-DOCUSATE SODIUM 8.6-50 MG PO TABS
2.0000 | ORAL_TABLET | ORAL | Status: DC
  Administered 2020-08-15 – 2020-08-17 (×3): 2 via ORAL
  Filled 2020-08-15 (×3): qty 2

## 2020-08-15 MED ORDER — LACTATED RINGERS IV SOLN: INTRAVENOUS | Status: DC

## 2020-08-15 MED ORDER — FENTANYL CITRATE (PF) 100 MCG/2ML IJ SOLN
INTRAMUSCULAR | Status: AC
  Filled 2020-08-15: qty 2

## 2020-08-15 MED ORDER — PHENYLEPHRINE HCL (PRESSORS) 10 MG/ML IV SOLN
INTRAVENOUS | Status: DC | PRN
  Administered 2020-08-15 (×3): 80 ug via INTRAVENOUS
  Administered 2020-08-15: 120 ug via INTRAVENOUS
  Administered 2020-08-15: 80 ug via INTRAVENOUS

## 2020-08-15 MED ORDER — SIMETHICONE 80 MG PO CHEW
80.0000 mg | CHEWABLE_TABLET | Freq: Three times a day (TID) | ORAL | Status: DC
  Administered 2020-08-15 – 2020-08-17 (×7): 80 mg via ORAL
  Filled 2020-08-15 (×8): qty 1

## 2020-08-15 MED ORDER — DEXAMETHASONE SODIUM PHOSPHATE 4 MG/ML IJ SOLN
INTRAMUSCULAR | Status: DC | PRN
  Administered 2020-08-15: 4 mg via INTRAVENOUS

## 2020-08-15 MED ORDER — FENTANYL CITRATE (PF) 100 MCG/2ML IJ SOLN
INTRAMUSCULAR | Status: DC | PRN
  Administered 2020-08-15: 15 ug via INTRATHECAL

## 2020-08-15 MED ORDER — MAGNESIUM SULFATE 40 GM/1000ML IV SOLN
2.0000 g/h | INTRAVENOUS | Status: DC
  Administered 2020-08-15 (×2): 2 g/h via INTRAVENOUS
  Filled 2020-08-15: qty 1000

## 2020-08-15 MED ORDER — NALOXONE HCL 0.4 MG/ML IJ SOLN: 0.4000 mg | INTRAMUSCULAR | Status: DC | PRN

## 2020-08-15 MED ORDER — ZOLPIDEM TARTRATE 5 MG PO TABS: 5.0000 mg | ORAL_TABLET | Freq: Every evening | ORAL | Status: DC | PRN

## 2020-08-15 MED ORDER — SCOPOLAMINE 1 MG/3DAYS TD PT72
MEDICATED_PATCH | TRANSDERMAL | Status: DC | PRN
  Administered 2020-08-15: 1 via TRANSDERMAL

## 2020-08-15 MED ORDER — SCOPOLAMINE 1 MG/3DAYS TD PT72
MEDICATED_PATCH | TRANSDERMAL | Status: AC
  Filled 2020-08-15: qty 1

## 2020-08-15 SURGICAL SUPPLY — 46 items
BARRIER ADHS 3X4 INTERCEED (GAUZE/BANDAGES/DRESSINGS) ×6 IMPLANT
BENZOIN TINCTURE PRP APPL 2/3 (GAUZE/BANDAGES/DRESSINGS) ×3 IMPLANT
CHLORAPREP W/TINT 26ML (MISCELLANEOUS) ×3 IMPLANT
CLAMP CORD UMBIL (MISCELLANEOUS) IMPLANT
CLOSURE WOUND 1/2 X4 (GAUZE/BANDAGES/DRESSINGS) ×1
CLOTH BEACON ORANGE TIMEOUT ST (SAFETY) ×3 IMPLANT
DRAPE C SECTION CLR SCREEN (DRAPES) ×3 IMPLANT
DRESSING TELFA 8X3 (GAUZE/BANDAGES/DRESSINGS) ×3 IMPLANT
DRSG OPSITE POSTOP 4X10 (GAUZE/BANDAGES/DRESSINGS) ×3 IMPLANT
ELECT REM PT RETURN 9FT ADLT (ELECTROSURGICAL) ×3
ELECTRODE REM PT RTRN 9FT ADLT (ELECTROSURGICAL) ×1 IMPLANT
EXTRACTOR VACUUM M CUP 4 TUBE (SUCTIONS) IMPLANT
EXTRACTOR VACUUM M CUP 4' TUBE (SUCTIONS)
GAUZE SPONGE 4X4 12PLY STRL LF (GAUZE/BANDAGES/DRESSINGS) ×9 IMPLANT
GLOVE BIOGEL PI IND STRL 7.0 (GLOVE) ×2 IMPLANT
GLOVE BIOGEL PI INDICATOR 7.0 (GLOVE) ×4
GLOVE ECLIPSE 6.5 STRL STRAW (GLOVE) ×3 IMPLANT
GOWN STRL REUS W/TWL LRG LVL3 (GOWN DISPOSABLE) ×6 IMPLANT
KIT ABG SYR 3ML LUER SLIP (SYRINGE) IMPLANT
NEEDLE HYPO 22GX1.5 SAFETY (NEEDLE) ×3 IMPLANT
NEEDLE HYPO 25X5/8 SAFETYGLIDE (NEEDLE) IMPLANT
NS IRRIG 1000ML POUR BTL (IV SOLUTION) ×3 IMPLANT
PACK C SECTION WH (CUSTOM PROCEDURE TRAY) ×3 IMPLANT
PAD ABD 7.5X8 STRL (GAUZE/BANDAGES/DRESSINGS) ×6 IMPLANT
PAD OB MATERNITY 4.3X12.25 (PERSONAL CARE ITEMS) ×3 IMPLANT
RTRCTR C-SECT PINK 25CM LRG (MISCELLANEOUS) IMPLANT
STRIP CLOSURE SKIN 1/2X4 (GAUZE/BANDAGES/DRESSINGS) ×2 IMPLANT
SUT CHROMIC GUT AB #0 18 (SUTURE) IMPLANT
SUT MNCRL 0 VIOLET CTX 36 (SUTURE) ×3 IMPLANT
SUT MON AB 2-0 SH 27 (SUTURE) ×2
SUT MON AB 2-0 SH27 (SUTURE) ×1 IMPLANT
SUT MON AB 3-0 SH 27 (SUTURE)
SUT MON AB 3-0 SH27 (SUTURE) IMPLANT
SUT MON AB 4-0 PS1 27 (SUTURE) ×3 IMPLANT
SUT MONOCRYL 0 CTX 36 (SUTURE) ×6
SUT PLAIN 2 0 (SUTURE) ×2
SUT PLAIN 2 0 XLH (SUTURE) ×3 IMPLANT
SUT PLAIN ABS 2-0 CT1 27XMFL (SUTURE) ×1 IMPLANT
SUT VIC AB 0 CT1 36 (SUTURE) ×6 IMPLANT
SUT VIC AB 2-0 CT1 27 (SUTURE) ×2
SUT VIC AB 2-0 CT1 TAPERPNT 27 (SUTURE) ×1 IMPLANT
SUT VIC AB 4-0 PS2 27 (SUTURE) IMPLANT
SYR CONTROL 10ML LL (SYRINGE) ×3 IMPLANT
TOWEL OR 17X24 6PK STRL BLUE (TOWEL DISPOSABLE) ×3 IMPLANT
TRAY FOLEY W/BAG SLVR 14FR LF (SET/KITS/TRAYS/PACK) IMPLANT
WATER STERILE IRR 1000ML POUR (IV SOLUTION) ×3 IMPLANT

## 2020-08-15 NOTE — Anesthesia Procedure Notes (Signed)
Spinal  Patient location during procedure: OR Start time: 08/15/2020 1:38 AM End time: 08/15/2020 1:42 AM Reason for block: surgical anesthesia Preanesthetic Checklist Completed: patient identified, IV checked, site marked, risks and benefits discussed, surgical consent, monitors and equipment checked, pre-op evaluation and timeout performed Spinal Block Patient position: sitting Prep: DuraPrep and site prepped and draped Patient monitoring: heart rate, cardiac monitor, continuous pulse ox and blood pressure Approach: midline Location: L3-4 Injection technique: single-shot Needle Needle type: Pencan  Needle gauge: 24 G Needle length: 9 cm Needle insertion depth: 7 cm Assessment Sensory level: T4 Events: CSF return Additional Notes Patient tolerated procedure well. Adequate sensory level.

## 2020-08-15 NOTE — H&P (Signed)
Stacey Zuniga is a 35 y.o. female presenting@ 37 3/[redacted] weeks gestation admitted for IOL after evaluation revealed mild preeclampsia. Pt was seen in office for routine Prenatal care and had severe range BP and was sent for further eval. (+) leg swelling no visual disturbance (+) mild h/a that resolved. Uterine fibroid . OB History     Gravida  1   Para      Term      Preterm      AB      Living         SAB      IAB      Ectopic      Multiple      Live Births             Past Medical History:  Diagnosis Date   Fibroid    Past Surgical History:  Procedure Laterality Date   HERNIA REPAIR  1987   TONSILLECTOMY AND ADENOIDECTOMY  1990   Family History: family history includes Arthritis in her mother; Hypertension in her mother. Social History:  reports that she has never smoked. She has never used smokeless tobacco. She reports that she does not drink alcohol and does not use drugs.     Maternal Diabetes: No Genetic Screening: Normal Maternal Ultrasounds/Referrals: Fetal renal pyelectasis Fetal Ultrasounds or other Referrals:  None Maternal Substance Abuse:  No Significant Maternal Medications:  None Significant Maternal Lab Results:  Group B Strep negative Other Comments:  initial twin gestation. Fetal hydronephrosis resolved, fibroid uteru  Review of Systems  Eyes:  Negative for visual disturbance.  Cardiovascular:  Positive for leg swelling.  Neurological:  Negative for headaches.  All other systems reviewed and are negative. History Dilation: Fingertip Effacement (%): Thick Station: Ballotable Exam by:: Dr. Garwin Brothers Blood pressure (!) 143/51, pulse 94, temperature 99.1 F (37.3 C), temperature source Oral, resp. rate 18, height 5\' 4"  (1.626 m), weight (!) 136.5 kg, last menstrual period 11/26/2019, SpO2 98 %. Exam Physical Exam Constitutional:      Appearance: She is well-developed. She is obese.  Eyes:     Pupils: Pupils are equal, round, and  reactive to light.  Cardiovascular:     Rate and Rhythm: Regular rhythm.     Heart sounds: Normal heart sounds.  Pulmonary:     Breath sounds: Normal breath sounds.  Abdominal:     Palpations: Abdomen is soft.     Comments: Obese gravid  Musculoskeletal:        General: Swelling present.     Cervical back: Neck supple.  Skin:    General: Skin is warm and dry.  Neurological:     Mental Status: She is alert and oriented to person, place, and time.     Motor: No weakness.  Psychiatric:        Mood and Affect: Mood normal.        Behavior: Behavior normal.    Prenatal labs: ABO, Rh: --/--/O POS (06/09 2140) Antibody: NEG (06/09 2140) Rubella: Immune (12/06 0000) RPR: Nonreactive (04/26 0000)  HBsAg:   negative HIV: Non-reactive (12/06 0000)  GBS:   negative  CBC Latest Ref Rng & Units 08/14/2020 08/14/2020  WBC 4.0 - 10.5 K/uL 7.7 7.6  Hemoglobin 12.0 - 15.0 g/dL 11.2(L) 10.9(L)  Hematocrit 36.0 - 46.0 % 34.0(L) 33.0(L)  Platelets 150 - 400 K/uL 243 236    CMP Latest Ref Rng & Units 08/14/2020  Glucose 70 - 99 mg/dL 109(H)  BUN 6 - 20  mg/dL <5(L)  Creatinine 0.44 - 1.00 mg/dL 0.46  Sodium 135 - 145 mmol/L 135  Potassium 3.5 - 5.1 mmol/L 3.5  Chloride 98 - 111 mmol/L 107  CO2 22 - 32 mmol/L 19(L)  Calcium 8.9 - 10.3 mg/dL 8.6(L)  Total Protein 6.5 - 8.1 g/dL 6.2(L)  Total Bilirubin 0.3 - 1.2 mg/dL 0.4  Alkaline Phos 38 - 126 U/L 87  AST 15 - 41 U/L 20  ALT 0 - 44 U/L 13   PCR: 0.37  Assessment/Plan: Mild preeclampsia Fibroid uterus IUP @ 37 3/7 week P) admit  start labetalol oral, oral cytotec for cervical ripening . Defer magnesium until active labor. HTN protocol prn   Stacey Zuniga A Stacey Zuniga 08/15/2020, 1:09 AM

## 2020-08-15 NOTE — Progress Notes (Signed)
Called regarding fetal heart rate decelerations after oral Cytotec was given( 12 am) Pt was given terbutaline during the process On arrival, pt was on left side, tracing: showed baseline 150 (+) variability without decel  Earlier strip showed recurrent deep variables decels  BP (!) 143/51   Pulse 94   Temp 99.1 F (37.3 C) (Oral)   Resp 18   Ht 5\' 4"  (1.626 m)   Wt (!) 136.5 kg   LMP 11/26/2019   SpO2 98%   BMI 51.65 kg/m  VE ft/60/-3 Tracing. Baseline FHR 150 (+) decel down to 101 assoc with ctx  IMP; fetal intolerance to labor Mild preeclampsia Given remote from delivery with inability to assess fetus  P) recommend primary C/S Procedure explained  Risk of surgery reviewed with pt including infection, bleeding, injury to  Bladder, bowel, ureter, internal scar tissue, poss need for blood transfusion and its risk( HIV, hepatitis, acute rxn). All ?answered

## 2020-08-15 NOTE — Transfer of Care (Signed)
Immediate Anesthesia Transfer of Care Note  Patient: Stacey Zuniga  Procedure(s) Performed: CESAREAN SECTION  Patient Location: PACU  Anesthesia Type:Spinal  Level of Consciousness: awake, alert  and oriented  Airway & Oxygen Therapy: Patient Spontanous Breathing  Post-op Assessment: Report given to RN and Post -op Vital signs reviewed and stable  Post vital signs: Reviewed and stable  Last Vitals:  Vitals Value Taken Time  BP 129/94 08/15/20 0340  Temp    Pulse 82 08/15/20 0343  Resp 16 08/15/20 0343  SpO2 100 % 08/15/20 0343  Vitals shown include unvalidated device data.  Last Pain:  Vitals:   08/15/20 0020  TempSrc: Oral  PainSc:          Complications: No notable events documented.

## 2020-08-15 NOTE — Progress Notes (Signed)
Low magnesium level ( 3.3) noted. Will do magnesium bolus now  O: Blood pressure 130/75, pulse 86, temperature 98.2 F (36.8 C), temperature source Oral, resp. rate 18, height 5\' 4"  (1.626 m), weight (!) 136.5 kg, last menstrual period 11/26/2019, SpO2 97 %, unknown if currently breastfeeding.   Alert and oriented  Baby in NICU Cont Magnesium x 24 hrs

## 2020-08-15 NOTE — Anesthesia Postprocedure Evaluation (Signed)
Anesthesia Post Note  Patient: Stacey Zuniga  Procedure(s) Performed: CESAREAN SECTION     Patient location during evaluation: PACU Anesthesia Type: Spinal Level of consciousness: oriented and awake and alert Pain management: pain level controlled Vital Signs Assessment: post-procedure vital signs reviewed and stable Respiratory status: spontaneous breathing, respiratory function stable and nonlabored ventilation Cardiovascular status: blood pressure returned to baseline and stable Postop Assessment: no headache, no backache, no apparent nausea or vomiting, spinal receding and patient able to bend at knees Anesthetic complications: no   No notable events documented.  Last Vitals:  Vitals:   08/15/20 0340 08/15/20 0345  BP: (!) 129/94 135/74  Pulse: 85   Resp: 18 17  Temp:  36.8 C  SpO2:      Last Pain:  Vitals:   08/15/20 0345  TempSrc:   PainSc: 0-No pain   Pain Goal:    LLE Motor Response: Purposeful movement, Responds to commands (08/15/20 0345) LLE Sensation: Numbness, Tingling (08/15/20 0345) RLE Motor Response: Purposeful movement, Responds to commands (08/15/20 0345) RLE Sensation: Numbness, Tingling (08/15/20 0345)     Epidural/Spinal Function Cutaneous sensation: Tingles (08/15/20 0345), Patient able to flex knees: No (08/15/20 0345), Patient able to lift hips off bed: No (08/15/20 0345), Back pain beyond tenderness at insertion site: No (08/15/20 0345), Progressively worsening motor and/or sensory loss: No (08/15/20 0345), Bowel and/or bladder incontinence post epidural: No (08/15/20 0345)  Rasheed Welty A.

## 2020-08-15 NOTE — Op Note (Signed)
NAMEMANJU, KULKARNI MEDICAL RECORD NO: [id number] ACCOUNT NO: [id number] DATE OF BIRTH: 07-Sep-1985 FACILITY: [facility] LOCATION: [id number] PHYSICIAN: Logen Heintzelman A. Garwin Brothers, MD  Operative Report   DATE OF PROCEDURE: 08/15/2020  PREOPERATIVE DIAGNOSES:  Fetal intolerance to labor, mild preeclampsia, intrauterine gestation at 3 and 4/7 weeks, fibroid uterus.  PROCEDURE:  Primary cesarean section, Kerr hysterotomy.  POSTOPERATIVE DIAGNOSES:  Fetal intolerance to labor, mild preeclampsia, intrauterine gestation at 73 and 4/7 weeks, fibroid uterus.  ANESTHESIA:  Spinal.  SURGEON:  Demani Weyrauch A. Garwin Brothers, MD  ASSISTANT:  None.  DESCRIPTION OF PROCEDURE:  Under adequate spinal anesthesia, the patient was placed in the supine position with a left lateral tilt.  She was sterilely prepped and draped in the usual fashion.  A traxi was used to pull up on the pannus.  0.25% Marcaine  was injected along the planned Pfannenstiel skin incision site.  Pfannenstiel skin incision was then made, carried down to the rectus fascia.  The rectus fascia was opened transversely.  Rectus fascia was then bluntly and sharply dissected off the rectus  muscle in a superior and inferior fashion.  The rectus muscle was split in the midline.  The parietal peritoneum was entered sharply and extended.  A self-retaining Alexis retractor was then placed.  Vesicouterine peritoneum was opened transversely.   The bladder was displaced inferiorly with blunt dissection.  A curvilinear low transverse uterine incision was then made and extended superiorly and inferiorly using blunt dissection.  Copious clear amniotic fluid was noted.  Subsequent delivery of a  live female from the right occiput transverse position with the left hand compound presentation was accomplished.  The baby was bulb suctioned on the abdomen.  The cord was delayed clamped x1 minute.  The cord was clamped and cut.  The baby was transferred  to the waiting  pediatrician whose assigned Apgars of 8 and 9 at one and five minutes respectively.  The uterine incision had no extension.  The placenta was expressed with massaging of the uterus.  The uterine cavity was extensively cleaned with a ring  forceps and sponge.  Uterine incision was closed in two layers, the first layer with 0 Monocryl running locked stitch.  Second layer was imbricated using 0 Monocryl suture.  Three figure-of-eight sutures were then placed for adequate hemostasis.  The  right tube and ovary was normal.  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  Left tube was otherwise normal.  The abdomen was  irrigated and suctioned of debris.  Palpable fibroids were noted in the cavity and the myometrium.  With good hemostasis noted, irrigation was done.  Interceed was placed in the inverted T fashion.  The Alexis retractor was then removed.  The parietal  peritoneum was then closed with 2-0 Vicryl.  The rectus fascia was closed with 0 Vicryl x2.  The subcutaneous area was irrigated, small bleeders cauterized.  Interrupted 2-0 plain suture was placed and the skin approximated using 4-0 Monocryl  subcuticular closure.  Steri-Strips and benzoin was placed and a pressure dressing was placed.  SPECIMEN:  Placenta sent to pathology.    ESTIMATED BLOOD LOSS:  814 mL  INTRAOPERATIVE FLUIDS:  1400 mL   URINE OUTPUT:  50 mL  Sponge and instrument counts x2 was correct.  COMPLICATIONS:  None.   The patient tolerated the procedure well and was transferred to recovery room  in stable condition.  The baby was transferred to the neonatal intensive care due to respiratory issues.   PAA D: 08/15/2020 3:47:05 am T: 08/15/2020 4:34:00 am  JOB: [id number]/ [id number]

## 2020-08-15 NOTE — Lactation Note (Signed)
This note was copied from a baby's chart. Lactation Consultation Note  Patient Name: Stacey Zuniga Date: 08/15/2020 Reason for consult: Initial assessment;1st time breastfeeding;Primapara;NICU baby;Early term 37-38.6wks Age:35 hours  Initial visit to P1 mother of 3 hours old ETI currently in NICU. Mother reports the intention to pump and bottlefeed infant. Mother has been proactively set up with a DEBP by RN. Mother states she is collecting nothing at this point. Explained normal volume of colostrum according to gestation.   Reinforced the importance of pumping as well as basics/cleaning/frequency. Talked about breast massage, hand expression and drops easily expressed. Noted edema around nipple. Mother reports having personal DEBP at home.   Reviewed Breastfeeding a NICU baby booklet and lactation services information with mother and encouraged to contact [name] for support and questions.   Maternal Data Has patient been taught Hand Expression?: Yes Does the patient have breastfeeding experience prior to this delivery?: No  Feeding Mother's Current Feeding Choice: Breast Milk Nipple Type: Nfant Extra Slow Flow (gold)  Lactation Tools Discussed/Used Tools: Pump;Flanges Flange Size: 24 Breast pump type: Double-Electric Breast Pump Pump Education: Setup, frequency, and cleaning;Milk Storage Reason for Pumping: nicu baby Pumping frequency: q3 Pumped volume:  (per mother, nothing yet)  Interventions Interventions: Breast feeding basics reviewed;Breast massage;Hand express;Reverse pressure;DEBP;Expressed milk;Education  Discharge Pump: Personal;DEBP WIC Program: No  Consult Status Consult Status: Follow-up Date: 08/16/20 Follow-up type: In-patient    Kyliyah Stirn A Higuera Ancidey 08/15/2020, 12:56 PM

## 2020-08-15 NOTE — Brief Op Note (Signed)
08/15/2020  3:32 AM  PATIENT:  Stacey Zuniga  35 y.o. female  PRE-OPERATIVE DIAGNOSIS: Fetal intolerance to labor, mild preeclampsia, IUP @ 37 4/[redacted] weeks gestation, Fibroid uterus  POST-OPERATIVE DIAGNOSIS: same  PROCEDURE:  Primary Cesarean section, kerr hysterotomy  SURGEON:  Surgeon(s) and Role:    * Servando Salina, MD - Primary  PHYSICIAN ASSISTANT:   ASSISTANTS: none   ANESTHESIA:   spinal  EBL:  83ml Findings: live female ROT left hand compound presentation, nl right tube and ovary, left ovary with endometriotic implants, IM/SM fibroids  BLOOD ADMINISTERED:none  DRAINS: none   LOCAL MEDICATIONS USED:  MARCAINE     SPECIMEN:  Source of Specimen:  placenta  DISPOSITION OF SPECIMEN:  PATHOLOGY  COUNTS:  YES  TOURNIQUET:  * No tourniquets in log *  DICTATION: .Other Dictation: Dictation Number [id number]  PLAN OF CARE: Admit to inpatient   PATIENT DISPOSITION:  PACU - hemodynamically stable.   Delay start of Pharmacological VTE agent (>24hrs) due to surgical blood loss or risk of bleeding: no

## 2020-08-15 NOTE — Anesthesia Preprocedure Evaluation (Signed)
Anesthesia Evaluation  Patient identified by MRN, date of birth, ID band Patient awake    Reviewed: Allergy & Precautions, NPO status , Patient's Chart, lab work & pertinent test results, reviewed documented beta blocker date and time   Airway Mallampati: III  TM Distance: >3 FB Neck ROM: Full    Dental no notable dental hx. (+) Teeth Intact, Dental Advisory Given   Pulmonary neg pulmonary ROS,    Pulmonary exam normal breath sounds clear to auscultation       Cardiovascular hypertension, Normal cardiovascular exam Rhythm:Regular Rate:Normal     Neuro/Psych negative neurological ROS  negative psych ROS   GI/Hepatic Neg liver ROS, GERD  Medicated and Controlled,  Endo/Other  Morbid obesity  Renal/GU negative Renal ROS  negative genitourinary   Musculoskeletal negative musculoskeletal ROS (+)   Abdominal (+) + obese,   Peds  Hematology  (+) anemia ,   Anesthesia Other Findings   Reproductive/Obstetrics (+) Pregnancy Pre Eclampsia Fetal intolerance to labor                               Anesthesia Physical Anesthesia Plan  ASA: 3 and emergent  Anesthesia Plan: Spinal   Post-op Pain Management:    Induction:   PONV Risk Score and Plan: 4 or greater and Treatment may vary due to age or medical condition and Scopolamine patch - Pre-op  Airway Management Planned: Natural Airway  Additional Equipment:   Intra-op Plan:   Post-operative Plan:   Informed Consent: I have reviewed the patients History and Physical, chart, labs and discussed the procedure including the risks, benefits and alternatives for the proposed anesthesia with the patient or authorized representative who has indicated his/her understanding and acceptance.     Dental advisory given  Plan Discussed with: CRNA and Anesthesiologist  Anesthesia Plan Comments:         Anesthesia Quick Evaluation

## 2020-08-16 LAB — CBC
HCT: 28.3 % — ABNORMAL LOW (ref 36.0–46.0)
Hemoglobin: 9.5 g/dL — ABNORMAL LOW (ref 12.0–15.0)
MCH: 31.3 pg (ref 26.0–34.0)
MCHC: 33.6 g/dL (ref 30.0–36.0)
MCV: 93.1 fL (ref 80.0–100.0)
Platelets: 228 10*3/uL (ref 150–400)
RBC: 3.04 MIL/uL — ABNORMAL LOW (ref 3.87–5.11)
RDW: 13.8 % (ref 11.5–15.5)
WBC: 11.8 10*3/uL — ABNORMAL HIGH (ref 4.0–10.5)
nRBC: 0 % (ref 0.0–0.2)

## 2020-08-16 MED ORDER — HYDROCHLOROTHIAZIDE 25 MG PO TABS
25.0000 mg | ORAL_TABLET | Freq: Every day | ORAL | Status: DC
  Administered 2020-08-16 – 2020-08-17 (×2): 25 mg via ORAL
  Filled 2020-08-16 (×2): qty 1

## 2020-08-16 NOTE — Progress Notes (Signed)
SVD: primary  S:  Pt reports feeling well. C/o burning on right side of incision/ Tolerating po/ Voiding without problems/ No n/v/ Bleeding is moderate/ Pain controlled withprescription NSAID's including ibuprofen (Motrin) and narcotic analgesics including oxycodone/acetaminophen (Percocet, Tylox)    O:  A & O x 3 / VS: Blood pressure (!) 143/85, pulse 79, temperature (!) 97.5 F (36.4 C), temperature source Oral, resp. rate 16, height 5\' 4"  (1.626 m), weight (!) 136.5 kg, last menstrual period 11/26/2019, SpO2 99 %, unknown if currently breastfeeding.  LABS:  Results for orders placed or performed during the hospital encounter of 08/14/20 (from the past 24 hour(s))  CBC     Status: Abnormal   Collection Time: 08/16/20  3:21 AM  Result Value Ref Range   WBC 11.8 (H) 4.0 - 10.5 K/uL   RBC 3.04 (L) 3.87 - 5.11 MIL/uL   Hemoglobin 9.5 (L) 12.0 - 15.0 g/dL   HCT 28.3 (L) 36.0 - 46.0 %   MCV 93.1 80.0 - 100.0 fL   MCH 31.3 26.0 - 34.0 pg   MCHC 33.6 30.0 - 36.0 g/dL   RDW 13.8 11.5 - 15.5 %   Platelets 228 150 - 400 K/uL   nRBC 0.0 0.0 - 0.2 %    I&O: I/O last 3 completed shifts: In: 1287 [P.O.:3558; I.V.:4142; IV Piggyback:150] Out: 8676 [Urine:3950; Blood:814]   No intake/output data recorded.  Lungs: chest clear, no wheezing, rales, normal symmetric air entry  Heart: regular rate and rhythm, S1, S2 normal, no murmur, click, rub or gallop  Abdomen: uterus firm surgical tender primary dressing d/c/i  Perineum: is normal  Lochia: moderate  Extremities:no redness or tenderness in the calves or thighs, edema 2+    A/P: POD # 1/PPD # 1/ G1P1001 S/p mild preeclampsia s/p magnesium sulfate, on labetalol Primary C/S  Doing well  Continue routine post partum orders  Will add HCTZ

## 2020-08-16 NOTE — Lactation Note (Signed)
This note was copied from a baby's chart. Lactation Consultation Note  Patient Name: Stacey Zuniga [id number] Date: 08/16/2020 Reason for consult: Follow-up assessment Age:35 hours  LC in to room for follow up. Infant is rooming in after brief stay in NICU. Per mother, infant has been feeding well. Mother is pumping and LC encourage consistency to help with milk supply establishment. Mother states she is not interested in latching infant, she plans to pump and bottlefeed. Reviewed hand expression, per mother's request. Edema is still present around nipples, LC provided shells to be used for 24h when not pumping, latching an/or sleeping. Colostrum is easily visible upon expression.  Encouraged parents to contact [facility] for support, questions or support.   Feeding Mother's Current Feeding Choice: Breast Milk and Formula  Lactation Tools Discussed/Used Tools: Flanges;Pump;Shells Breast pump type: Double-Electric Breast Pump Reason for Pumping: mother's preference Pumping frequency: 8-12 time in 24h Pumped volume:  (drops, per mother)  Interventions Interventions: Breast feeding basics reviewed;Education;Expressed milk;Shells;DEBP;Reverse pressure;Hand express;Breast massage  Discharge Discharge Education: Engorgement and breast care  Consult Status Consult Status: Follow-up Date: 08/17/20 Follow-up type: In-patient    Raequon Catanzaro A Higuera Ancidey 08/16/2020, 8:37 PM

## 2020-08-16 NOTE — Progress Notes (Signed)
CSW met with MOB to complete consult for Edinburgh of 12. CSW observed MOB standing at couch and infant in bassinet. CSW explained role and reason for consult. MOB was pleasant, polite, and engaged with CSW. MOB reported, history of anxiety and depression. MOB reported, experiencing symptoms 2-3 years ago, but was never diagnosed. MOB reported, she has been able to manage symptoms without any medication.   CSW provided education regarding the baby blues period vs. perinatal mood disorders, discussed treatment and gave resources for mental health follow up if concerns arise. CSW recommends self- evaluation during the postpartum time period using the New Mom Checklist from Postpartum Progress and encouraged MOB to contact a medical professional if symptoms are noted at any time.   When CSW asked MOB of her emotions since delivery. MOB reported, she feels, "good, just a little tired". MOB reported, FOB is her support. MOB denied SI, HI, and DV when CSW assessed for safety.   MOB reported, there are no barriers to follow up infant's care. MOB reported, she has all essentials needed to care for infant. MOB reported, infant has a car seat, crib, and bassinet. MOB denied any additional barriers.     CSW provided education on sudden infant death syndrome (SIDS).    CSW identifies no further need for intervention or barriers to discharge at this time.   Darcus Austin, MSW, LCSW-A Clinical Social Worker [phone]

## 2020-08-16 NOTE — Progress Notes (Signed)
Late note. Called Dr Garwin Brothers at 2350 on 08/15/20 to see if patient 's Magnesium drip should be discontinued at it's original start time since she did get an additional bolus dose at around 1500.After giving report she decided to still stop the Mag drip at 0300.

## 2020-08-17 MED ORDER — HYDROCHLOROTHIAZIDE 25 MG PO TABS: 25.0000 mg | ORAL_TABLET | Freq: Every day | ORAL | 0 refills | Status: AC

## 2020-08-17 MED ORDER — OXYCODONE HCL 5 MG PO TABS: 5.0000 mg | ORAL_TABLET | Freq: Four times a day (QID) | ORAL | 0 refills | Status: AC | PRN

## 2020-08-17 MED ORDER — IBUPROFEN 600 MG PO TABS: 600.0000 mg | ORAL_TABLET | Freq: Four times a day (QID) | ORAL | 11 refills | Status: AC

## 2020-08-17 NOTE — Plan of Care (Signed)
Continues to diurese well.DTR's are normal.Blood pressures have been under parameters,and patient has good pain control.

## 2020-08-17 NOTE — Discharge Summary (Signed)
Postpartum Discharge Summary  Date of Service updated      Patient Name: Stacey Zuniga DOB: Jan 28, 1986 MRN: [id number]  Date of admission: 08/14/2020 Delivery date:08/15/2020  Delivering provider: Jeromiah Ohalloran  Date of discharge: 08/17/2020  Admitting diagnosis: Pre-eclampsia [O14.90] Postpartum care following cesarean delivery [Z39.2] Intrauterine pregnancy: [redacted]w[redacted]d    Secondary diagnosis:  Active Problems:   Pre-eclampsia   Postpartum care following cesarean delivery  Additional problems: n/a    Discharge diagnosis: Term Pregnancy Delivered and Preeclampsia (mild)                                              Post partum procedures: n/a Augmentation: N/A Complications: None  Hospital course: Induction of Labor With Cesarean Section   35y.o. yo G1P1001 at [id number] admitted to the hospital 08/14/2020 for induction of labor. Patient had a labor course significant for fetal intolerance to labor with cervical ripening using cytotec.. The patient went for cesarean section due to Non-Reassuring FHR. Delivery details are as follows: Membrane Rupture Time/Date: 2:18 AM ,08/15/2020   Delivery Method:C-Section, Low Transverse  Details of operation can be found in separate operative Note.  Patient had an uncomplicated postpartum course. She is ambulating, tolerating a regular diet, passing flatus, and urinating well.  Patient is discharged home in stable condition on 08/17/20.      Newborn Data: Birth date:08/15/2020  Birth time:2:19 AM  Gender:Female  Living status:Living  Apgars:8 ,9  Weight:2.67 kg                                Magnesium Sulfate received: Yes: Seizure prophylaxis BMZ received: No Rhophylac:No MMR:No T-DaP:Given prenatally Flu: No Transfusion:No  Physical exam  Vitals:   08/17/20 0355 08/17/20 0500 08/17/20 0510 08/17/20 1221  BP: (!) 152/98 132/76 132/76 (!) 144/68  Pulse: 87 83 90 87  Resp: '18 18  19  ' Temp: 98.6 F (37 C)   98.2 F (36.8 C)   TempSrc: Oral Oral  Oral  SpO2: 97% 98% 98% 98%  Weight:      Height:       General: alert, cooperative, and no distress Lochia: appropriate Uterine Fundus: firm Incision: Dressing is clean, dry, and intact DVT Evaluation: No evidence of DVT seen on physical exam. Calf/Ankle edema is present Labs: Lab Results  Component Value Date   WBC 11.8 (H) 08/16/2020   HGB 9.5 (L) 08/16/2020   HCT 28.3 (L) 08/16/2020   MCV 93.1 08/16/2020   PLT 228 08/16/2020   CMP Latest Ref Rng & Units 08/14/2020  Glucose 70 - 99 mg/dL 109(H)  BUN 6 - 20 mg/dL <5(L)  Creatinine 0.44 - 1.00 mg/dL 0.46  Sodium 135 - 145 mmol/L 135  Potassium 3.5 - 5.1 mmol/L 3.5  Chloride 98 - 111 mmol/L 107  CO2 22 - 32 mmol/L 19(L)  Calcium 8.9 - 10.3 mg/dL 8.6(L)  Total Protein 6.5 - 8.1 g/dL 6.2(L)  Total Bilirubin 0.3 - 1.2 mg/dL 0.4  Alkaline Phos 38 - 126 U/L 87  AST 15 - 41 U/L 20  ALT 0 - 44 U/L 13   Edinburgh Score: Edinburgh Postnatal Depression Scale Screening Tool 08/15/2020  I have been able to laugh and see the funny side of things. 0  I have looked forward with enjoyment  to things. 1  I have blamed myself unnecessarily when things went wrong. 2  I have been anxious or worried for no good reason. 2  I have felt scared or panicky for no good reason. 2  Things have been getting on top of me. 1  I have been so unhappy that I have had difficulty sleeping. 2  I have felt sad or miserable. 1  I have been so unhappy that I have been crying. 1  The thought of harming myself has occurred to me. 0  Edinburgh Postnatal Depression Scale Total 12      After visit meds:  Allergies as of 08/17/2020   No Known Allergies      Medication List     TAKE these medications    fluticasone 50 MCG/ACT nasal spray Commonly known as: FLONASE Place into both nostrils daily.   hydrochlorothiazide 25 MG tablet Commonly known as: HYDRODIURIL Take 1 tablet (25 mg total) by mouth daily for 5 days. Start taking  on: August 18, 2020   ibuprofen 600 MG tablet Commonly known as: ADVIL Take 1 tablet (600 mg total) by mouth every 6 (six) hours.   oxyCODONE 5 MG immediate release tablet Commonly known as: Oxy IR/ROXICODONE Take 1-2 tablets (5-10 mg total) by mouth every 6 (six) hours as needed for up to 7 days for moderate pain.   pantoprazole 40 MG tablet Commonly known as: PROTONIX Take 40 mg by mouth daily.   prenatal multivitamin Tabs tablet Take 1 tablet by mouth daily at 12 noon.               Discharge Care Instructions  (From admission, onward)           Start     Ordered   08/17/20 0000  Discharge wound care:       Comments: Remove honeycomb dressing in 5 days   08/17/20 1229             Discharge home in stable condition Infant Feeding: Bottle and Breast Infant Disposition:home with mother Discharge instruction: per After Visit Summary and Postpartum booklet. Activity: Advance as tolerated. Pelvic rest for 6 weeks.  Diet: low salt diet Anticipated Birth Control: Unsure Postpartum Appointment: Additional Postpartum F/U: BP check 1 week Future Appointments:No future appointments. Follow up Visit:      08/17/2020 Marvene Staff, MD

## 2020-08-17 NOTE — Discharge Instructions (Signed)
Call if temperature greater than equal to 100.4, nothing per vagina for 4-6 weeks or severe nausea vomiting, increased incisional pain , drainage or redness in the incision site, no straining with bowel movements, showers no bathCall if temperature greater than equal to 100.4, nothing per vagina for 4-6 weeks or severe nausea vomiting, increased incisional pain , drainage or redness in the incision site, no straining with bowel movements, showers no bath 

## 2020-08-17 NOTE — Progress Notes (Signed)
SVD: primary  S:  Pt reports feeling well/ Tolerating po/ Voiding without problems/ No n/v/ Bleeding is moderate/ Pain controlled withprescription NSAID's including ibuprofen (Motrin) and narcotic analgesics including oxycodone/acetaminophen (Percocet, Tylox)    O:  A & O x 3 / VS: Blood pressure (!) 144/68, pulse 87, temperature 98.2 F (36.8 C), temperature source Oral, resp. rate 19, height 5\' 4"  (1.626 m), weight (!) 136.5 kg, last menstrual period 11/26/2019, SpO2 98 %, unknown if currently breastfeeding.  LABS: No results found for this or any previous visit (from the past 24 hour(s)).  I&O: I/O last 3 completed shifts: In: 2113.6 [P.O.:1558; I.V.:555.6] Out: 3200 [Urine:3200]   No intake/output data recorded.  Lungs: chest clear, no wheezing, rales, normal symmetric air entry  Heart: regular rate and rhythm, S1, S2 normal, no murmur, click, rub or gallop  Abdomen: uterus firm 1 FB above umb. Primary dressing c/d/i  Perineum: not inspected  Lochia: moderate  Extremities:no redness or tenderness in the calves or thighs, edema 1+    A/P: POD # 2/PPD # 2/ [id number]  Doing well  Continue routine post partum orders   D/c  instructions reviewed  Cont with labetalol, HCTZ F/u 6wk pp  BP check 1 wk

## 2020-08-17 NOTE — Progress Notes (Signed)
Low transverse pressure dressing removed per Dr. Garwin Brothers.  Steri Strips intact.  Honeycomb dressing applied.  Patient tolerated well.

## 2020-08-18 LAB — SURGICAL PATHOLOGY
